# Patient Record
Sex: Male | Born: 2000 | Race: White | Hispanic: No | Marital: Single | Smoking: Never smoker
Health system: Southern US, Community
[De-identification: ages and names within clinical notes are randomized; demographics above are authoritative.]

## PROBLEM LIST (undated history)

## (undated) DIAGNOSIS — Q6601 Congenital talipes equinovarus, right foot: Secondary | ICD-10-CM

## (undated) DIAGNOSIS — Q6602 Congenital talipes equinovarus, left foot: Secondary | ICD-10-CM

## (undated) DIAGNOSIS — Q6689 Other  specified congenital deformities of feet: Secondary | ICD-10-CM

## (undated) HISTORY — PX: FOOT SURGERY: SHX648

---
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
## 2019-10-08 ENCOUNTER — Ambulatory Visit
Admission: EM | Admit: 2019-10-08 | Discharge: 2019-10-08 | Disposition: A | Discharge status: Home / Self Care | Payer: Medicaid Other | Attending: Emergency Medicine | Admitting: Emergency Medicine

## 2019-10-08 ENCOUNTER — Other Ambulatory Visit: Payer: Self-pay

## 2019-10-08 DIAGNOSIS — Z20822 Contact with and (suspected) exposure to covid-19: Secondary | ICD-10-CM | POA: Diagnosis not present

## 2019-10-08 NOTE — ED Provider Notes (Signed)
[facility]   [id number] 10/08/19 Arrival Time: 1244   CC: COVID exposure  SUBJECTIVE: History from: patient.  Bill Munoz is a 19 y.o. male who presents for COVID testing.  COVID exposure 3 days ago.  Denies recent travel.  Denies aggravating or alleviating symptoms.  Denies previous COVID infection.   Denies fever, chills, fatigue, nasal congestion, rhinorrhea, sore throat, cough, SOB, wheezing, chest pain, nausea, vomiting, changes in bowel or bladder habits.    ROS: As per HPI.  All other pertinent ROS negative.     Past Medical History:  Diagnosis Date  . Bilateral club feet    Past Surgical History:  Procedure Laterality Date  . FOOT SURGERY     bil club feet   No Known Allergies No current facility-administered medications on file prior to encounter.   Current Outpatient Medications on File Prior to Encounter  Medication Sig Dispense Refill  . cetirizine (ZYRTEC) 5 MG chewable tablet Chew 1 tablet (5 mg total) by mouth daily. 30 tablet 0  . predniSONE (DELTASONE) 10 MG tablet Take 4 tablets (40 mg total) by mouth daily. 12 tablet 0   Social History   Socioeconomic History  . Marital status: Single    Spouse name: Not on file  . Number of children: Not on file  . Years of education: Not on file  . Highest education level: Not on file  Occupational History  . Not on file  Tobacco Use  . Smoking status: Passive Smoke Exposure - Never Smoker  Substance and Sexual Activity  . Alcohol use: Not on file  . Drug use: Not on file  . Sexual activity: Not on file  Other Topics Concern  . Not on file  Social History Narrative  . Not on file   Social Determinants of Health   Financial Resource Strain:   . Difficulty of Paying Living Expenses: Not on file  Food Insecurity:   . Worried About Programme researcher, broadcasting/film/video in the Last Year: Not on file  . Ran Out of Food in the Last Year: Not on file  Transportation Needs:   . Lack of Transportation (Medical):  Not on file  . Lack of Transportation (Non-Medical): Not on file  Physical Activity:   . Days of Exercise per Week: Not on file  . Minutes of Exercise per Session: Not on file  Stress:   . Feeling of Stress : Not on file  Social Connections:   . Frequency of Communication with Friends and Family: Not on file  . Frequency of Social Gatherings with Friends and Family: Not on file  . Attends Religious Services: Not on file  . Active Member of Clubs or Organizations: Not on file  . Attends Banker Meetings: Not on file  . Marital Status: Not on file  Intimate Partner Violence:   . Fear of Current or Ex-Partner: Not on file  . Emotionally Abused: Not on file  . Physically Abused: Not on file  . Sexually Abused: Not on file   Family History  Problem Relation Age of Onset  . Healthy Mother   . Healthy Father     OBJECTIVE:  Vitals:   10/08/19 1327  BP: 110/61  Pulse: 83  Resp: 16  Temp: 97.9 F (36.6 C)  TempSrc: Oral  SpO2: 98%     General appearance: alert; well-appearing, nontoxic; speaking in full sentences and tolerating own secretions HEENT: NCAT; Ears: EACs clear, TMs pearly gray; Eyes: PERRL.  EOM  grossly intact.Nose: nares patent without rhinorrhea, Throat: oropharynx clear, tonsils non erythematous or enlarged, uvula midline  Neck: supple without LAD Lungs: unlabored respirations, symmetrical air entry; cough: absent; no respiratory distress; CTAB Heart: regular rate and rhythm.  Skin: warm and dry Psychological: alert and cooperative; normal mood and affect  ASSESSMENT & PLAN:  1. Suspected COVID-19 virus infection   2. Exposure to COVID-19 virus     No orders of the defined types were placed in this encounter.   COVID testing ordered.  It will take between 5-7 days for test results.  Someone will contact you regarding abnormal results.    In the meantime: You should remain isolated in your home for 10 days from symptom onset AND greater  than 72 hours after symptoms resolution (absence of fever without the use of fever-reducing medication and improvement in respiratory symptoms), whichever is longer OR 14 days from exposure Get plenty of rest and push fluids Use OTC zyrtec for nasal congestion, runny nose, and/or sore throat Use OTC flonase for nasal congestion and runny nose Use medications daily for symptom relief Use OTC medications like ibuprofen or tylenol as needed fever or pain Follow up with PCP regarding results Call or go to the ED if you have any new or worsening symptoms such as fever, cough, shortness of breath, chest tightness, chest pain, turning blue, changes in mental status, etc...   Reviewed expectations re: course of current medical issues. Questions answered. Outlined signs and symptoms indicating need for more acute intervention. Patient verbalized understanding. After Visit Summary given.         Lestine Box, PA-C 10/08/19 1342

## 2019-10-08 NOTE — Discharge Instructions (Signed)

## 2019-10-08 NOTE — ED Triage Notes (Signed)
Pt presents to UC for covid test. Pt states he rode in the car w/ a friend who was covid positive 3 days ago. Denies symptoms.

## 2019-10-09 LAB — NOVEL CORONAVIRUS, NAA: SARS-CoV-2, NAA: NOT DETECTED

## 2020-05-25 ENCOUNTER — Other Ambulatory Visit: Payer: Self-pay

## 2020-05-25 ENCOUNTER — Ambulatory Visit
Admission: EM | Admit: 2020-05-25 | Discharge: 2020-05-25 | Disposition: A | Discharge status: Home / Self Care | Payer: Medicaid Other | Attending: Emergency Medicine | Admitting: Emergency Medicine

## 2020-05-25 DIAGNOSIS — R6889 Other general symptoms and signs: Secondary | ICD-10-CM | POA: Diagnosis not present

## 2020-05-26 LAB — NOVEL CORONAVIRUS, NAA: SARS-CoV-2, NAA: NOT DETECTED

## 2020-05-30 ENCOUNTER — Ambulatory Visit
Admission: EM | Admit: 2020-05-30 | Discharge: 2020-05-30 | Disposition: A | Discharge status: Home / Self Care | Payer: Medicaid Other | Attending: Emergency Medicine | Admitting: Emergency Medicine

## 2020-05-30 ENCOUNTER — Encounter: Payer: Self-pay | Admitting: Emergency Medicine

## 2020-05-30 ENCOUNTER — Other Ambulatory Visit: Payer: Self-pay

## 2020-05-30 DIAGNOSIS — Z20822 Contact with and (suspected) exposure to covid-19: Secondary | ICD-10-CM | POA: Diagnosis not present

## 2020-05-30 NOTE — ED Triage Notes (Signed)
covid test exposure

## 2020-06-03 LAB — NOVEL CORONAVIRUS, NAA: SARS-CoV-2, NAA: DETECTED — AB

## 2020-06-12 ENCOUNTER — Ambulatory Visit
Admission: EM | Admit: 2020-06-12 | Discharge: 2020-06-12 | Discharge status: Absent without leave | Payer: Medicaid Other

## 2021-11-10 ENCOUNTER — Other Ambulatory Visit: Payer: Self-pay

## 2021-11-10 ENCOUNTER — Encounter: Payer: Self-pay | Admitting: Emergency Medicine

## 2021-11-10 ENCOUNTER — Ambulatory Visit
Admission: EM | Admit: 2021-11-10 | Discharge: 2021-11-10 | Disposition: A | Discharge status: Home / Self Care | Payer: BC Managed Care – PPO | Attending: Emergency Medicine | Admitting: Emergency Medicine

## 2021-11-10 DIAGNOSIS — B349 Viral infection, unspecified: Secondary | ICD-10-CM | POA: Diagnosis not present

## 2021-11-10 NOTE — ED Provider Notes (Signed)
Roderic Palau    CSN: [id number] Arrival date & time: 11/10/21  1429      History   Chief Complaint Chief Complaint  Patient presents with   Headache   Nasal Congestion    HPI YAAKOV SEPPALA is a 21 y.o. male.  Patient presents with 3-day history of sinus congestion, postnasal drip, runny nose, mild cough.  No fever, rash, shortness of breath, vomiting, diarrhea, or other symptoms.  Treatment at home with OTC cough syrup.  No pertinent medical history.   The history is provided by the patient.   Past Medical History:  Diagnosis Date   Bilateral club feet     There are no problems to display for this patient.   Past Surgical History:  Procedure Laterality Date   FOOT SURGERY     bil club feet       Home Medications    Prior to Admission medications   Medication Sig Start Date End Date Taking? Authorizing Provider  cetirizine (ZYRTEC) 5 MG chewable tablet Chew 1 tablet (5 mg total) by mouth daily. 02/26/16   Harvel Quale, MD  predniSONE (DELTASONE) 10 MG tablet Take 4 tablets (40 mg total) by mouth daily. 02/27/16   Harvel Quale, MD    Family History Family History  Problem Relation Age of Onset   Healthy Mother    Healthy Father     Social History Social History   Tobacco Use   Smoking status: Never    Passive exposure: Yes   Smokeless tobacco: Never  Vaping Use   Vaping Use: Every day  Substance Use Topics   Alcohol use: Never   Drug use: Never     Allergies   Patient has no known allergies.   Review of Systems Review of Systems  Constitutional:  Negative for chills and fever.  HENT:  Positive for congestion, postnasal drip and rhinorrhea. Negative for ear pain and sore throat.   Respiratory:  Positive for cough. Negative for shortness of breath.   Cardiovascular:  Negative for chest pain and palpitations.  Gastrointestinal:  Negative for diarrhea and vomiting.  Skin:  Negative for color change and rash.  All other  systems reviewed and are negative.   Physical Exam Triage Vital Signs ED Triage Vitals  Enc Vitals Group     BP      Pulse      Resp      Temp      Temp src      SpO2      Weight      Height      Head Circumference      Peak Flow      Pain Score      Pain Loc      Pain Edu?      Excl. in [place]?    No data found.  Updated Vital Signs BP (!) 146/90    Pulse (!) 113    Temp 98.2 F (36.8 C)    Resp 18    SpO2 96%   Visual Acuity Right Eye Distance:   Left Eye Distance:   Bilateral Distance:    Right Eye Near:   Left Eye Near:    Bilateral Near:     Physical Exam Vitals and nursing note reviewed.  Constitutional:      General: He is not in acute distress.    Appearance: Normal appearance. He is well-developed. He is not ill-appearing.  HENT:  Right Ear: Tympanic membrane normal.     Left Ear: Tympanic membrane normal.     Nose: Nose normal.     Mouth/Throat:     Mouth: Mucous membranes are moist.     Pharynx: Oropharynx is clear.  Cardiovascular:     Rate and Rhythm: Normal rate and regular rhythm.     Heart sounds: Normal heart sounds.  Pulmonary:     Effort: Pulmonary effort is normal. No respiratory distress.     Breath sounds: Normal breath sounds.  Musculoskeletal:     Cervical back: Neck supple.  Skin:    General: Skin is warm and dry.  Neurological:     Mental Status: He is alert.  Psychiatric:        Mood and Affect: Mood normal.        Behavior: Behavior normal.     UC Treatments / Results  Labs (all labs ordered are listed, but only abnormal results are displayed) Labs Reviewed  COVID-19, FLU A+B AND RSV    EKG   Radiology No results found.  Procedures Procedures (including critical care time)  Medications Ordered in UC Medications - No data to display  Initial Impression / Assessment and Plan / UC Course  I have reviewed the triage vital signs and the nursing notes.  Pertinent labs & imaging results that were available  during my care of the patient were reviewed by me and considered in my medical decision making (see chart for details).    Viral illness.  COVID, Flu, RSV pending.  Instructed patient to self quarantine per CDC guidelines.  Discussed symptomatic treatment including Tylenol or ibuprofen, rest, hydration.  Instructed patient to follow up with PCP if symptoms are not improving.  Patient agrees to plan of care.   Final Clinical Impressions(s) / UC Diagnoses   Final diagnoses:  Viral illness     Discharge Instructions      Your COVID, RSV, and Flu tests are pending.  You should self quarantine until the test results are back.    Take Tylenol or ibuprofen as needed for fever or discomfort.  Rest and keep yourself hydrated.    Follow-up with your primary care provider if your symptoms are not improving.         ED Prescriptions   None    PDMP not reviewed this encounter.   Sharion Balloon, NP 11/10/21 1511

## 2021-11-10 NOTE — ED Triage Notes (Signed)
Pt here with headache and pressure, congestion and drainage x 3 days.

## 2021-11-10 NOTE — Discharge Instructions (Addendum)
Your COVID, RSV, and Flu tests are pending.  You should self quarantine until the test results are back.    Take Tylenol or ibuprofen as needed for fever or discomfort.  Rest and keep yourself hydrated.    Follow-up with your primary care provider if your symptoms are not improving.     

## 2021-11-12 LAB — COVID-19, FLU A+B AND RSV
Influenza A, NAA: NOT DETECTED
Influenza B, NAA: NOT DETECTED
RSV, NAA: NOT DETECTED
SARS-CoV-2, NAA: NOT DETECTED

## 2021-11-16 ENCOUNTER — Ambulatory Visit (INDEPENDENT_AMBULATORY_CARE_PROVIDER_SITE_OTHER): Payer: BC Managed Care – PPO

## 2021-11-16 ENCOUNTER — Ambulatory Visit
Admission: RE | Admit: 2021-11-16 | Discharge: 2021-11-16 | Disposition: A | Discharge status: Home / Self Care | Payer: BC Managed Care – PPO | Source: Ambulatory Visit | Attending: Internal Medicine | Admitting: Internal Medicine

## 2021-11-16 ENCOUNTER — Other Ambulatory Visit: Payer: Self-pay

## 2021-11-16 VITALS — BP 131/84 | HR 122 | Temp 101.0°F | Resp 16

## 2021-11-16 DIAGNOSIS — R059 Cough, unspecified: Secondary | ICD-10-CM | POA: Diagnosis not present

## 2021-11-16 DIAGNOSIS — J209 Acute bronchitis, unspecified: Secondary | ICD-10-CM | POA: Diagnosis not present

## 2021-11-16 DIAGNOSIS — J039 Acute tonsillitis, unspecified: Secondary | ICD-10-CM | POA: Diagnosis not present

## 2021-11-16 LAB — POCT RAPID STREP A (OFFICE): Rapid Strep A Screen: NEGATIVE

## 2021-11-16 LAB — POCT MONO SCREEN (KUC): Mono, POC: NEGATIVE

## 2021-11-16 MED ORDER — ALBUTEROL SULFATE (2.5 MG/3ML) 0.083% IN NEBU
2.5000 mg | INHALATION_SOLUTION | Freq: Once | RESPIRATORY_TRACT | Status: AC
  Administered 2021-11-16: 2.5 mg via RESPIRATORY_TRACT

## 2021-11-16 MED ORDER — ALBUTEROL SULFATE HFA 108 (90 BASE) MCG/ACT IN AERS: 2.0000 | INHALATION_SPRAY | RESPIRATORY_TRACT | 0 refills | Status: AC | PRN

## 2021-11-16 MED ORDER — AZITHROMYCIN 500 MG PO TABS: 500.0000 mg | ORAL_TABLET | Freq: Every day | ORAL | 0 refills | Status: AC

## 2021-11-16 NOTE — ED Triage Notes (Signed)
Pt here with cough, fever, congestion x 2 weeks.

## 2021-11-16 NOTE — Discharge Instructions (Addendum)
Your mono test, strep A test and chest xray are all negative.  Work on stop vaping You may alternate Ibuprofen 800 mg every 8 hours and in between do Tylenol up to 1000 mg for the fever.

## 2021-11-16 NOTE — ED Provider Notes (Signed)
Bill Munoz    CSN: [id number] Arrival date & time: 11/16/21  1447      History   Chief Complaint Chief Complaint  Patient presents with   Cough   Fever   Nasal Congestion    HPI Bill Munoz is a 21 y.o. male who presents with URI x 1 week, but started having a fever and worse cough 3 days ago. Had negative Covid, Flu and RSV last week. He vapes and has been wheezing.     Past Medical History:  Diagnosis Date   Bilateral club feet     There are no problems to display for this patient.   Past Surgical History:  Procedure Laterality Date   FOOT SURGERY     bil club feet       Home Medications    Prior to Admission medications   Medication Sig Start Date End Date Taking? Authorizing Provider  albuterol (VENTOLIN HFA) 108 (90 Base) MCG/ACT inhaler Inhale 2 puffs into the lungs every 4 (four) hours as needed for wheezing or shortness of breath. For 5 days, then prn 11/16/21  Yes Rodriguez-Southworth, Sunday Spillers, PA-C  azithromycin (ZITHROMAX) 500 MG tablet Take 1 tablet (500 mg total) by mouth daily. 11/16/21  Yes Rodriguez-Southworth, Sunday Spillers, PA-C  cetirizine (ZYRTEC) 5 MG chewable tablet Chew 1 tablet (5 mg total) by mouth daily. 02/26/16   Harvel Quale, MD  predniSONE (DELTASONE) 10 MG tablet Take 4 tablets (40 mg total) by mouth daily. 02/27/16   Harvel Quale, MD    Family History Family History  Problem Relation Age of Onset   Healthy Mother    Healthy Father     Social History Social History   Tobacco Use   Smoking status: Never    Passive exposure: Yes   Smokeless tobacco: Never  Vaping Use   Vaping Use: Every day  Substance Use Topics   Alcohol use: Never   Drug use: Never     Allergies   Patient has no known allergies.   Review of Systems Review of Systems  Constitutional:  Positive for chills and fever. Negative for activity change and appetite change.  HENT:  Positive for congestion, rhinorrhea and sore throat.  Negative for postnasal drip and trouble swallowing.   Eyes:  Negative for discharge.  Respiratory:  Positive for cough and wheezing.   Musculoskeletal:  Positive for myalgias. Negative for gait problem.  Skin:  Negative for rash.  Neurological:  Negative for headaches.    Physical Exam Triage Vital Signs ED Triage Vitals  Enc Vitals Group     BP 11/16/21 1507 131/84     Pulse Rate 11/16/21 1507 (!) 122     Resp 11/16/21 1507 16     Temp 11/16/21 1507 (!) 101 F (38.3 C)     Temp Source 11/16/21 1507 Oral     SpO2 11/16/21 1507 95 %     Weight --      Height --      Head Circumference --      Peak Flow --      Pain Score 11/16/21 1453 0     Pain Loc --      Pain Edu? --      Excl. in [place]? --    No data found.  Updated Vital Signs BP 131/84 (BP Location: Left Arm)    Pulse (!) 122    Temp (!) 101 F (38.3 C) (Oral)    Resp 16  SpO2 95%   Visual Acuity Right Eye Distance:   Left Eye Distance:   Bilateral Distance:    Right Eye Near:   Left Eye Near:    Bilateral Near:     Physical Exam Vitals and nursing note reviewed.  Constitutional:      General: He is not in acute distress.    Appearance: He is not toxic-appearing.  HENT:     Right Ear: Tympanic membrane, ear canal and external ear normal.     Left Ear: Tympanic membrane, ear canal and external ear normal.     Nose: Nose normal.     Mouth/Throat:     Mouth: Mucous membranes are moist.     Pharynx: Oropharyngeal exudate and posterior oropharyngeal erythema present.  Eyes:     General: No scleral icterus.    Conjunctiva/sclera: Conjunctivae normal.  Pulmonary:     Effort: Pulmonary effort is normal.     Breath sounds: Wheezing present.  Musculoskeletal:        General: Normal range of motion.     Cervical back: Neck supple.  Lymphadenopathy:     Cervical: Cervical adenopathy present.  Skin:    General: Skin is warm and dry.     Findings: No rash.  Neurological:     Mental Status: He is alert and  oriented to person, place, and time.     Gait: Gait normal.  Psychiatric:        Mood and Affect: Mood normal.        Behavior: Behavior normal.        Thought Content: Thought content normal.        Judgment: Judgment normal.     UC Treatments / Results  Labs (all labs ordered are listed, but only abnormal results are displayed) Labs Reviewed  POCT RAPID STREP A (OFFICE) - Normal  CULTURE, GROUP A STREP [facility])  POCT MONO SCREEN ([facility])  Strep A and Mono negative  EKG   Radiology DG Chest 2 View  Result Date: 11/16/2021 CLINICAL DATA:  Cough. EXAM: CHEST - 2 VIEW COMPARISON:  October 06, 2015. FINDINGS: The heart size and mediastinal contours are within normal limits. Both lungs are clear. The visualized skeletal structures are unremarkable. IMPRESSION: No active cardiopulmonary disease. Electronically Signed   By: Marijo Conception M.D.   On: 11/16/2021 15:27    Procedures Procedures (including critical care time)  Medications Ordered in UC Medications  albuterol (PROVENTIL) (2.5 MG/3ML) 0.083% nebulizer solution 2.5 mg (2.5 mg Nebulization Given 11/16/21 1540)    Initial Impression / Assessment and Plan / UC Course  I have reviewed the triage vital signs and the nursing notes. Pertinent labs & imaging results that were available during my care of the patient were reviewed by me and considered in my medical decision making (see chart for details). He was given albuterol neb treatment and the wheezing resolved.  Has exudative tonsillitis and bronchitis I sent out the throat culture Started him on Azithromycin and Proventil as noted. See instructions.    Final Clinical Impressions(s) / UC Diagnoses   Final diagnoses:  Exudative tonsillitis  Acute bronchitis, unspecified organism     Discharge Instructions      Your mono test, strep A test and chest xray are all negative.  Work on stop vaping You may alternate Ibuprofen 800 mg every 8 hours and in between do Tylenol  up to 1000 mg for the fever.      ED Prescriptions  Medication Sig Dispense Auth. Provider   albuterol (VENTOLIN HFA) 108 (90 Base) MCG/ACT inhaler Inhale 2 puffs into the lungs every 4 (four) hours as needed for wheezing or shortness of breath. For 5 days, then prn 18 g Rodriguez-Southworth, Dong Nimmons, PA-C   azithromycin (ZITHROMAX) 500 MG tablet Take 1 tablet (500 mg total) by mouth daily. 5 tablet Rodriguez-Southworth, Sunday Spillers, PA-C      PDMP not reviewed this encounter.   Shelby Mattocks, [place] 11/16/21 1630

## 2021-11-19 LAB — CULTURE, GROUP A STREP (THRC)

## 2023-07-22 ENCOUNTER — Emergency Department: Payer: BC Managed Care – PPO

## 2023-07-22 ENCOUNTER — Other Ambulatory Visit: Payer: Self-pay

## 2023-07-22 DIAGNOSIS — S0101XA Laceration without foreign body of scalp, initial encounter: Secondary | ICD-10-CM | POA: Diagnosis not present

## 2023-07-22 DIAGNOSIS — S0990XA Unspecified injury of head, initial encounter: Secondary | ICD-10-CM | POA: Diagnosis present

## 2023-07-22 DIAGNOSIS — Z23 Encounter for immunization: Secondary | ICD-10-CM | POA: Insufficient documentation

## 2023-07-22 DIAGNOSIS — W01190A Fall on same level from slipping, tripping and stumbling with subsequent striking against furniture, initial encounter: Secondary | ICD-10-CM | POA: Insufficient documentation

## 2023-07-22 NOTE — ED Triage Notes (Signed)
Pt presents to ER from home with laceration to back of head. Pt reports he was drinking alcohol and slipped and fell backwards, hitting the back of his head on the corner of the coffee table. Pt has small laceration to back of head no bleeding at present. Pt denies losing consciousness. Pt talks in complete sentences no distress noted

## 2023-07-23 ENCOUNTER — Emergency Department
Admission: EM | Admit: 2023-07-23 | Discharge: 2023-07-23 | Disposition: A | Discharge status: Home / Self Care | Payer: BC Managed Care – PPO | Attending: Emergency Medicine | Admitting: Emergency Medicine

## 2023-07-23 DIAGNOSIS — W19XXXA Unspecified fall, initial encounter: Secondary | ICD-10-CM

## 2023-07-23 DIAGNOSIS — S0990XA Unspecified injury of head, initial encounter: Secondary | ICD-10-CM

## 2023-07-23 DIAGNOSIS — S0101XA Laceration without foreign body of scalp, initial encounter: Secondary | ICD-10-CM

## 2023-07-23 MED ORDER — TETANUS-DIPHTH-ACELL PERTUSSIS 5-2.5-18.5 LF-MCG/0.5 IM SUSY
0.5000 mL | PREFILLED_SYRINGE | Freq: Once | INTRAMUSCULAR | Status: AC
  Administered 2023-07-23: 0.5 mL via INTRAMUSCULAR
  Filled 2023-07-23: qty 0.5

## 2023-07-23 NOTE — Discharge Instructions (Signed)
Your tetanus has been updated and will be good for 10 years.  Return to the ER for worsening symptoms, persistent vomiting, lethargy or other concerns.

## 2023-07-23 NOTE — ED Provider Notes (Signed)
[facility] Provider Note    Event Date/Time   First MD Initiated Contact with Patient 07/23/23 0222     (approximate)   History   Fall   HPI  Bill Munoz is a 22 y.o. male who presents to the ED from home with a chief complaint of head injury.  Patient states he was drinking alcohol, slipped and fell backwards striking the corner of his wooden coffee table with the back of his head.  Denies LOC.  Tetanus is not up-to-date.  Denies vision changes, headache, neck pain, chest pain, shortness of breath, nausea, vomiting or dizziness.  Denies anticoagulant use.     Past Medical History   Past Medical History:  Diagnosis Date   Bilateral club feet      Active Problem List  There are no problems to display for this patient.    Past Surgical History   Past Surgical History:  Procedure Laterality Date   FOOT SURGERY     bil club feet     Home Medications   Prior to Admission medications   Medication Sig Start Date End Date Taking? Authorizing Provider  albuterol (VENTOLIN HFA) 108 (90 Base) MCG/ACT inhaler Inhale 2 puffs into the lungs every 4 (four) hours as needed for wheezing or shortness of breath. For 5 days, then prn 11/16/21   Rodriguez-Southworth, Nettie Elm, PA-C  azithromycin (ZITHROMAX) 500 MG tablet Take 1 tablet (500 mg total) by mouth daily. 11/16/21   Rodriguez-Southworth, Nettie Elm, PA-C  cetirizine (ZYRTEC) 5 MG chewable tablet Chew 1 tablet (5 mg total) by mouth daily. 02/26/16   Leta Baptist, MD  predniSONE (DELTASONE) 10 MG tablet Take 4 tablets (40 mg total) by mouth daily. 02/27/16   Leta Baptist, MD     Allergies  Patient has no known allergies.   Family History   Family History  Problem Relation Age of Onset   Healthy Mother    Healthy Father      Physical Exam  Triage Vital Signs: ED Triage Vitals  Encounter Vitals Group     BP 07/22/23 2243 (!) 145/86     Systolic BP Percentile --      Diastolic BP  Percentile --      Pulse Rate 07/22/23 2243 91     Resp 07/22/23 2243 16     Temp 07/22/23 2243 98.4 F (36.9 C)     Temp Source 07/22/23 2243 Oral     SpO2 07/22/23 2243 95 %     Weight 07/22/23 2245 200 lb (90.7 kg)     Height 07/22/23 2245 5\' 3"  (1.6 m)     Head Circumference --      Peak Flow --      Pain Score 07/22/23 2245 5     Pain Loc --      Pain Education --      Exclude from Growth Chart --     Updated Vital Signs: BP (!) 145/86   Pulse 91   Temp 98.4 F (36.9 C) (Oral)   Resp 16   Ht 5\' 3"  (1.6 m)   Wt 90.7 kg   SpO2 95%   BMI 35.43 kg/m    General: Awake, no distress.  CV:  Good peripheral perfusion.  Resp:  Normal effort.  Abd:  No distention.  Other:  Small puncture wound to occipital scalp without active bleeding.  PERRL.  EOMI.  Nose is atraumatic.  No dental malocclusion.  No midline cervical spine  tenderness to palpation, step-offs or deformities noted.  Alert and oriented x 3.  CN II-XII grossly intact.  5/5 motor strength and sensation all extremities.  M AE x 4.   ED Results / Procedures / Treatments  Labs (all labs ordered are listed, but only abnormal results are displayed) Labs Reviewed - No data to display   EKG  None   RADIOLOGY I have independently visualized and interpreted patient's imaging studies as well as noted the radiology interpretation:  CT head: No ICH  CT cervical spine: No acute traumatic injury  Official radiology report(s): CT Head Wo Contrast  Result Date: 07/23/2023 CLINICAL DATA:  Intoxication, fall, scalp laceration. Head trauma, moderate-severe; Neck trauma, intoxicated or obtunded (Age >= 16y) EXAM: CT HEAD WITHOUT CONTRAST CT CERVICAL SPINE WITHOUT CONTRAST TECHNIQUE: Multidetector CT imaging of the head and cervical spine was performed following the standard protocol without intravenous contrast. Multiplanar CT image reconstructions of the cervical spine were also generated. RADIATION DOSE REDUCTION: This  exam was performed according to the departmental dose-optimization program which includes automated exposure control, adjustment of the mA and/or kV according to patient size and/or use of iterative reconstruction technique. COMPARISON:  None Available. FINDINGS: CT HEAD FINDINGS Brain: Normal anatomic configuration. No abnormal intra or extra-axial mass lesion or fluid collection. No abnormal mass effect or midline shift. No evidence of acute intracranial hemorrhage or infarct. Ventricular size is normal. Cerebellum unremarkable. Vascular: Unremarkable Skull: Intact Sinuses/Orbits: Paranasal sinuses are clear. Orbits are unremarkable. Other: Mastoid air cells and middle ear cavities are clear. Small right parasagittal occipital scalp hematoma noted. CT CERVICAL SPINE FINDINGS Alignment: Normal. Skull base and vertebrae: No acute fracture. No primary bone lesion or focal pathologic process. Soft tissues and spinal canal: No prevertebral fluid or swelling. No visible canal hematoma. Disc levels: Intervertebral disc heights are preserved. Prevertebral soft tissues are not thickened on sagittal reformats . Spinal canal is widely patent. No significant neuroforaminal narrowing. Upper chest: Negative. Other: None IMPRESSION: 1. No acute intracranial abnormality. No calvarial fracture. Small right parasagittal occipital scalp hematoma. 2. No acute fracture or listhesis of the cervical spine. Electronically Signed   By: Helyn Numbers M.D.   On: 07/23/2023 00:09   CT Cervical Spine Wo Contrast  Result Date: 07/23/2023 CLINICAL DATA:  Intoxication, fall, scalp laceration. Head trauma, moderate-severe; Neck trauma, intoxicated or obtunded (Age >= 16y) EXAM: CT HEAD WITHOUT CONTRAST CT CERVICAL SPINE WITHOUT CONTRAST TECHNIQUE: Multidetector CT imaging of the head and cervical spine was performed following the standard protocol without intravenous contrast. Multiplanar CT image reconstructions of the cervical spine were  also generated. RADIATION DOSE REDUCTION: This exam was performed according to the departmental dose-optimization program which includes automated exposure control, adjustment of the mA and/or kV according to patient size and/or use of iterative reconstruction technique. COMPARISON:  None Available. FINDINGS: CT HEAD FINDINGS Brain: Normal anatomic configuration. No abnormal intra or extra-axial mass lesion or fluid collection. No abnormal mass effect or midline shift. No evidence of acute intracranial hemorrhage or infarct. Ventricular size is normal. Cerebellum unremarkable. Vascular: Unremarkable Skull: Intact Sinuses/Orbits: Paranasal sinuses are clear. Orbits are unremarkable. Other: Mastoid air cells and middle ear cavities are clear. Small right parasagittal occipital scalp hematoma noted. CT CERVICAL SPINE FINDINGS Alignment: Normal. Skull base and vertebrae: No acute fracture. No primary bone lesion or focal pathologic process. Soft tissues and spinal canal: No prevertebral fluid or swelling. No visible canal hematoma. Disc levels: Intervertebral disc heights are preserved. Prevertebral soft tissues are  not thickened on sagittal reformats . Spinal canal is widely patent. No significant neuroforaminal narrowing. Upper chest: Negative. Other: None IMPRESSION: 1. No acute intracranial abnormality. No calvarial fracture. Small right parasagittal occipital scalp hematoma. 2. No acute fracture or listhesis of the cervical spine. Electronically Signed   By: Helyn Numbers M.D.   On: 07/23/2023 00:09     PROCEDURES:  Critical Care performed: No  Procedures   MEDICATIONS ORDERED IN ED: Medications  Tdap (BOOSTRIX) injection 0.5 mL (has no administration in time range)     IMPRESSION / MDM / ASSESSMENT AND PLAN / ED COURSE  I reviewed the triage vital signs and the nursing notes.                             22 year old male presenting with minor head injury.  Offered staple repair for scalp  puncture wound; patient declines.  Will update tetanus.  Strict return precautions given.  Patient verbalizes understanding and agrees with plan of care.  Patient's presentation is most consistent with acute, uncomplicated illness.   FINAL CLINICAL IMPRESSION(S) / ED DIAGNOSES   Final diagnoses:  Fall, initial encounter  Laceration of scalp, initial encounter  Minor head injury, initial encounter     Rx / DC Orders   ED Discharge Orders     None        Note:  This document was prepared using Dragon voice recognition software and may include unintentional dictation errors.   Irean Hong, MD 07/23/23 [phone]

## 2023-09-19 IMAGING — DX DG CHEST 2V
2 series · 2 of 2 positions shown · non-contrast
Comparison: October 06, 2015.

CLINICAL DATA: Cough.

EXAM:
CHEST - 2 VIEW

[chest pa]
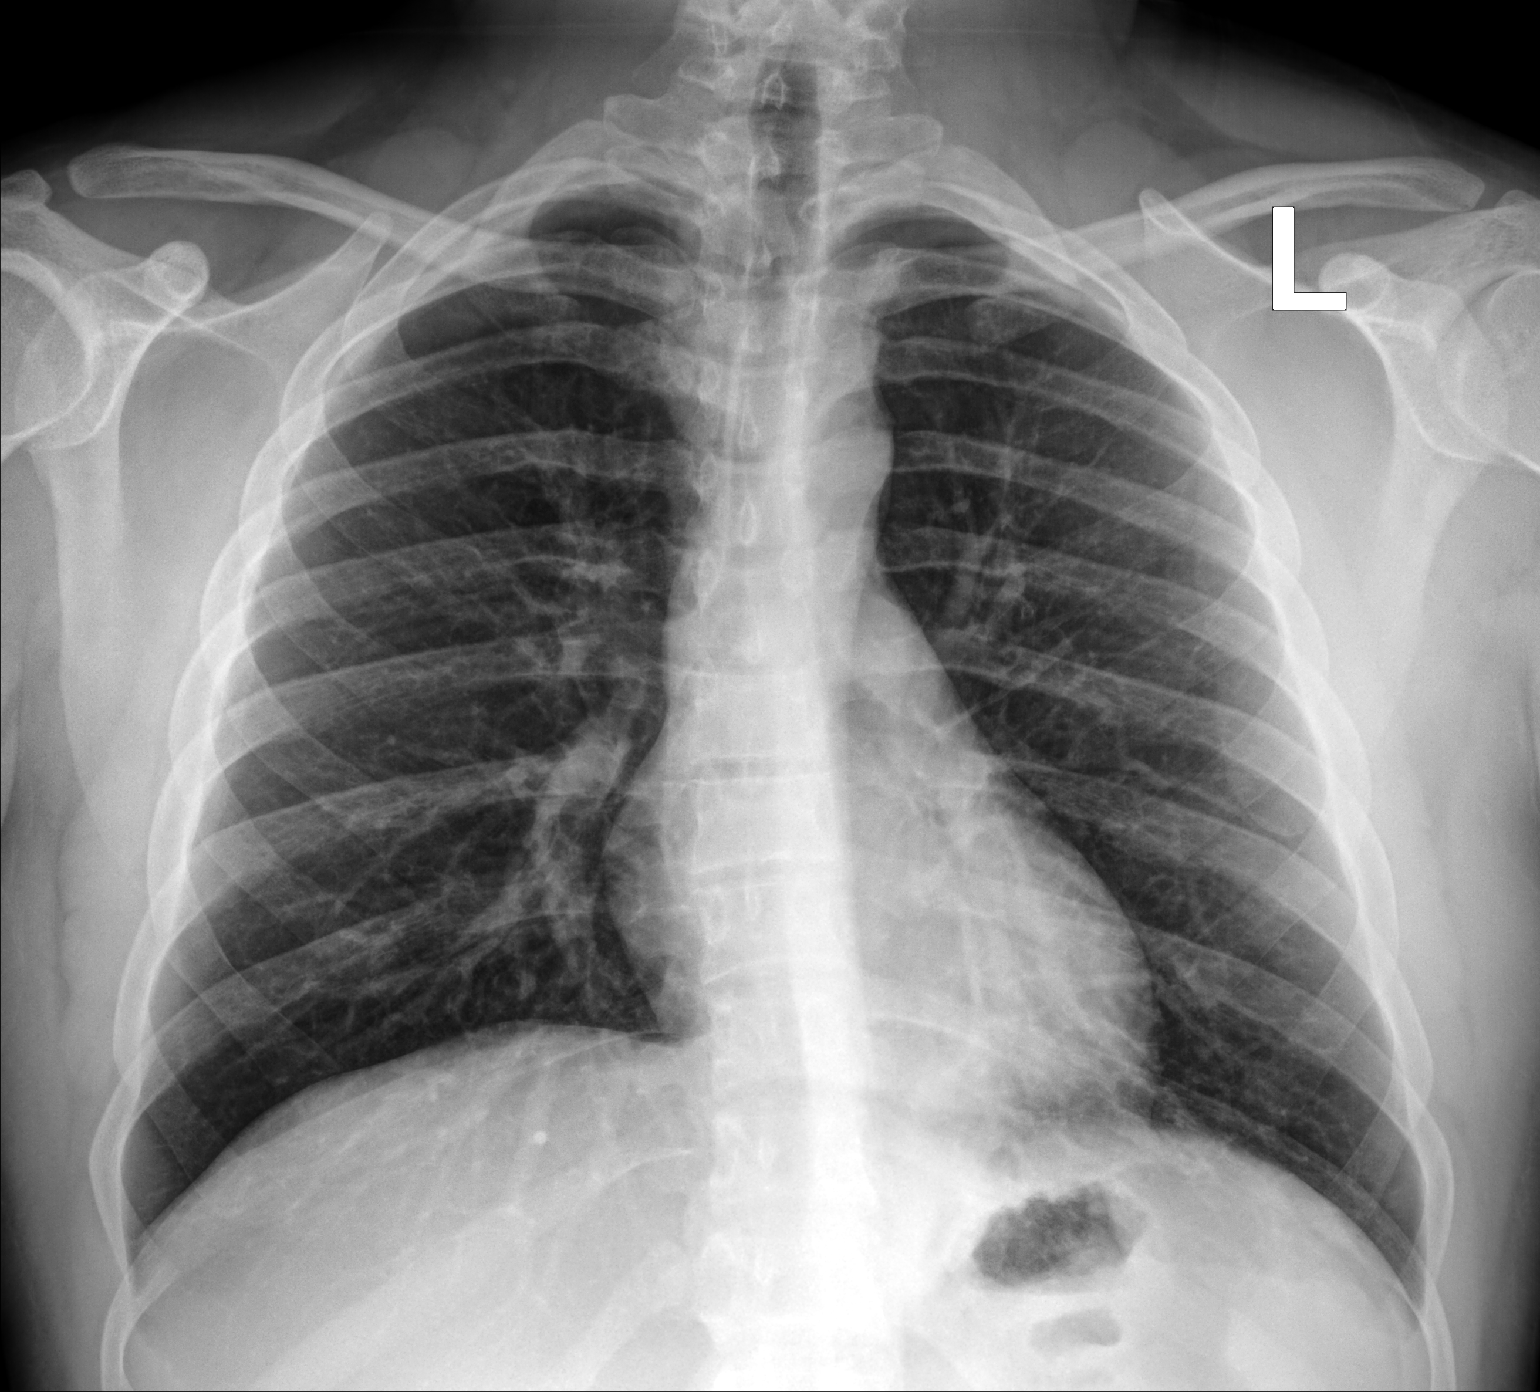

[chest lat]
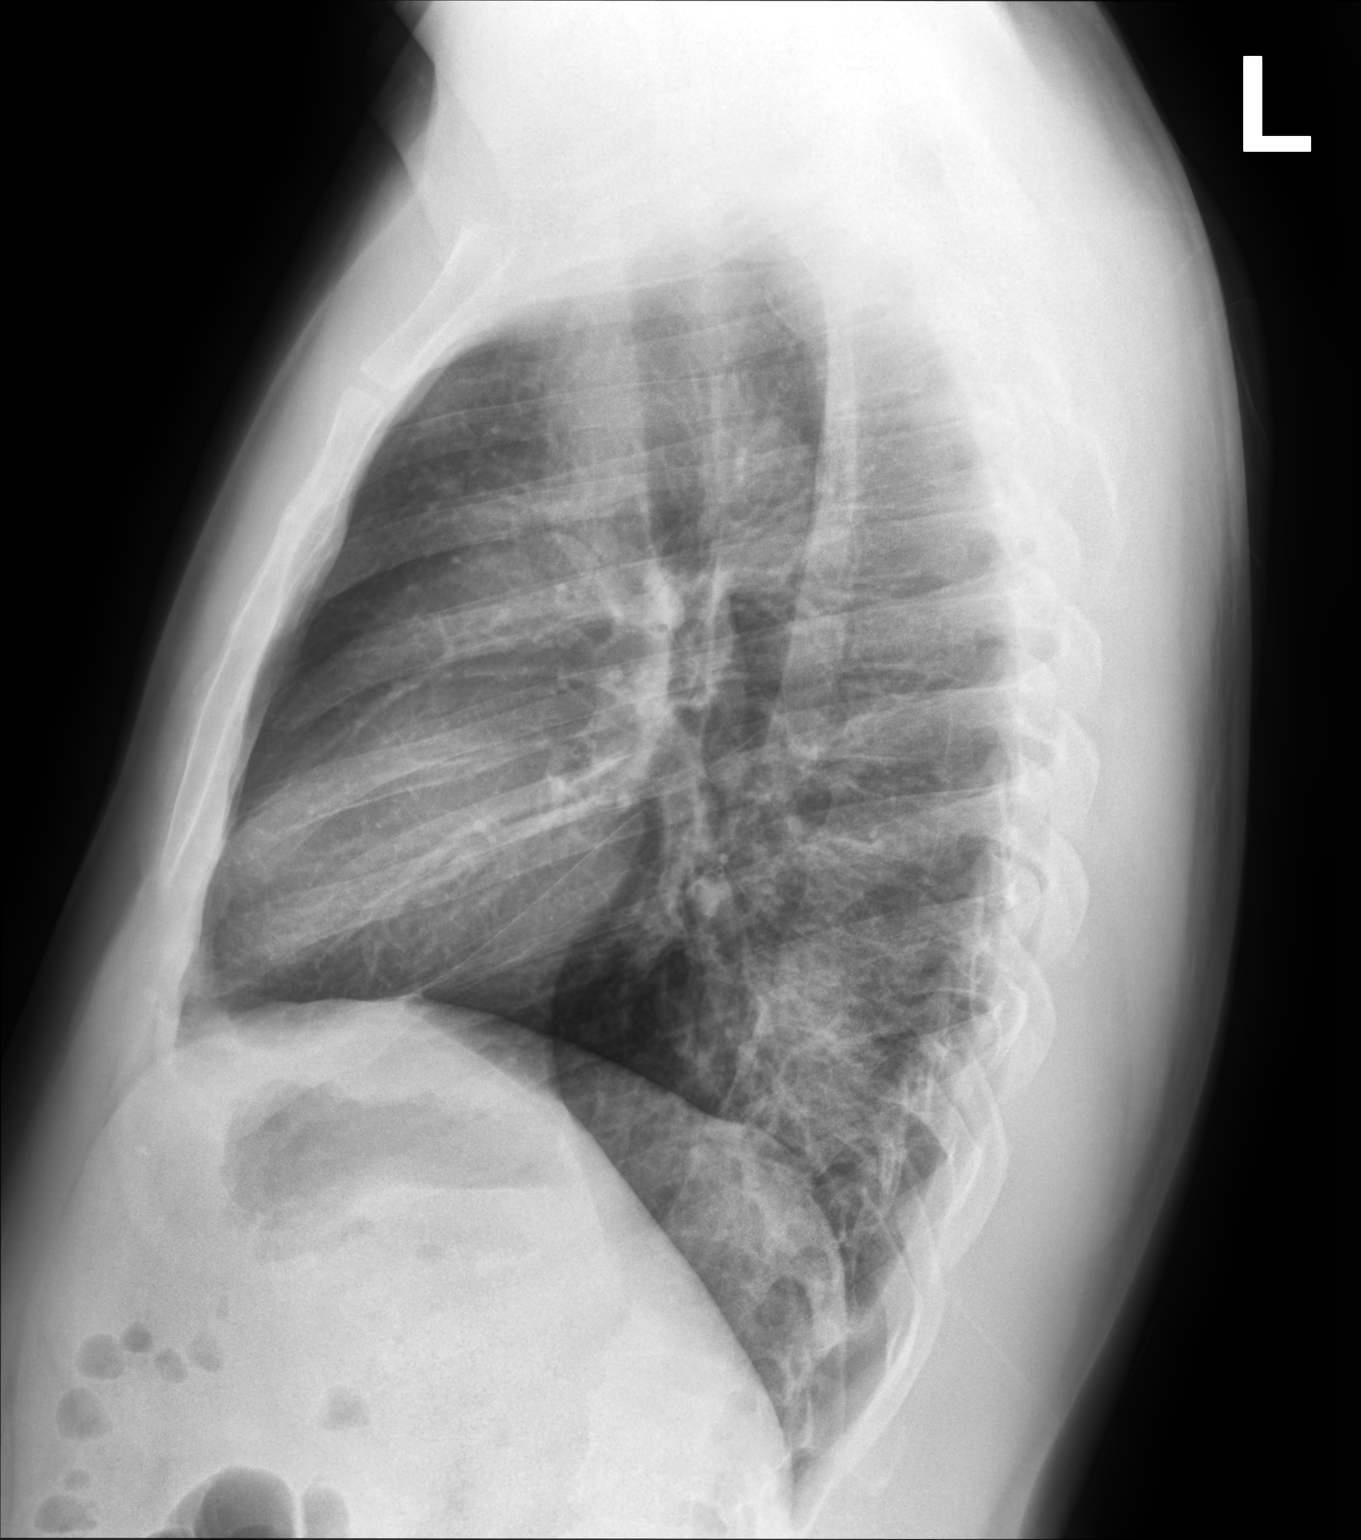

[2 of 2 positions shown; findings below may reference images not displayed]

FINDINGS: The heart size and mediastinal contours are within normal limits.
Both lungs are clear. The visualized skeletal structures are
unremarkable.
IMPRESSION: No active cardiopulmonary disease.

## 2024-04-22 ENCOUNTER — Encounter: Payer: Self-pay | Admitting: Emergency Medicine

## 2024-04-22 ENCOUNTER — Ambulatory Visit
Admission: EM | Admit: 2024-04-22 | Discharge: 2024-04-22 | Disposition: A | Discharge status: Home / Self Care | Attending: Emergency Medicine | Admitting: Emergency Medicine

## 2024-04-22 DIAGNOSIS — L237 Allergic contact dermatitis due to plants, except food: Secondary | ICD-10-CM | POA: Diagnosis not present

## 2024-04-22 MED ORDER — PREDNISONE 10 MG (21) PO TBPK: ORAL_TABLET | Freq: Every day | ORAL | 0 refills | Status: AC

## 2024-04-22 MED ORDER — DEXAMETHASONE SODIUM PHOSPHATE 10 MG/ML IJ SOLN: 10.0000 mg | Freq: Once | INTRAMUSCULAR | Status: DC

## 2024-04-22 NOTE — Discharge Instructions (Signed)
Today you are being treated for the poison ivy/oak rash  You have been given an injection of steroids today in the office today to help reduce the inflammatory process that occurs with this rash which will help minimize your itching as well as begin to clear  Starting tomorrow take prednisone every morning with food as directed, to continue the above process  You may continue use of topical calamine or Benadryl cream to help manage itching, you may also continue oral Benadryl  Please avoid long exposures to heat such as a hot steamy shower or being outside as this may cause further irritation to your rash  You may follow-up with his urgent care as needed if symptoms persist or worsen 

## 2024-04-22 NOTE — ED Triage Notes (Signed)
 Patient reports red itchy rash that started Friday. 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 Patient has used poison ivy  cream and benadryl  for symptoms.  Last dose of Benadryl  was last night took 50 mg. Denies pain.

## 2024-04-22 NOTE — ED Provider Notes (Signed)
 Bill Munoz    CSN: [id number] Arrival date & time: 04/22/24  1226      History   Chief Complaint Chief Complaint  Patient presents with   Rash    HPI Bill Munoz is a 23 y.o. male.   Patient presents for evaluation of a erythematous pruritic rash present to the bilateral upper extremities and the chest beginning 2 days ago.  Noticed some clear drainage to the left arm but denies purulent drainage.  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  Denies changes in diet, toiletries, medications or recent travel, known sick contact with similar symptoms.  Denies fever.  Has attempted use of poison ivy cream and Benadryl  without relief.  Past Medical History:  Diagnosis Date   Bilateral club feet     There are no active problems to display for this patient.   Past Surgical History:  Procedure Laterality Date   FOOT SURGERY     bil club feet       Home Medications    Prior to Admission medications   Medication Sig Start Date End Date Taking? Authorizing Provider  predniSONE  (STERAPRED UNI-PAK 21 TAB) 10 MG (21) TBPK tablet Take by mouth daily. Take 6 tabs by mouth daily  for 1 days, then 5 tabs for 1 days, then 4 tabs for 1 days, then 3 tabs for 1 days, 2 tabs for 1 days, then 1 tab by mouth daily for 1 days 04/22/24  Yes Amayrani Bennick R, NP  albuterol  (VENTOLIN  HFA) 108 (90 Base) MCG/ACT inhaler Inhale 2 puffs into the lungs every 4 (four) hours as needed for wheezing or shortness of breath. For 5 days, then prn 11/16/21   Rodriguez-Southworth, Sylvia, PA-C  azithromycin  (ZITHROMAX ) 500 MG tablet Take 1 tablet (500 mg total) by mouth daily. 11/16/21   Rodriguez-Southworth, Sylvia, PA-C  cetirizine  (ZYRTEC ) 5 MG chewable tablet Chew 1 tablet (5 mg total) by mouth daily. 02/26/16   Leontine Damien Boor, MD    Family History Family History  Problem Relation Age of Onset   Healthy Mother    Healthy Father     Social  History Social History   Tobacco Use   Smoking status: Never    Passive exposure: Yes   Smokeless tobacco: Never  Vaping Use   Vaping status: Every Day  Substance Use Topics   Alcohol use: Never   Drug use: Never     Allergies   Patient has no known allergies.   Review of Systems Review of Systems  Skin:  Positive for rash.     Physical Exam Triage Vital Signs ED Triage Vitals  Encounter Vitals Group     BP 04/22/24 1230 127/80     Girls Systolic BP Percentile --      Girls Diastolic BP Percentile --      Boys Systolic BP Percentile --      Boys Diastolic BP Percentile --      Pulse Rate 04/22/24 1230 73     Resp 04/22/24 1230 18     Temp 04/22/24 1230 98.3 F (36.8 C)     Temp Source 04/22/24 1230 Oral     SpO2 04/22/24 1230 99 %     Weight --      Height --      Head Circumference --      Peak Flow --      Pain Score 04/22/24 1234 0  Pain Loc --      Pain Education --      Exclude from Growth Chart --    No data found.  Updated Vital Signs BP 127/80 (BP Location: Right Arm)   Pulse 73   Temp 98.3 F (36.8 C) (Oral)   Resp 18   SpO2 99%   Visual Acuity Right Eye Distance:   Left Eye Distance:   Bilateral Distance:    Right Eye Near:   Left Eye Near:    Bilateral Near:     Physical Exam Constitutional:      Appearance: Normal appearance.  Eyes:     Extraocular Movements: Extraocular movements intact.  Pulmonary:     Effort: Pulmonary effort is normal.  Skin:    Comments: Erythematous blistering rash present to the bilateral upper extremities and across the center of the chest, clear serous drainage noted to the blister on the left arm  Neurological:     Mental Status: He is alert and oriented to person, place, and time.      UC Treatments / Results  Labs (all labs ordered are listed, but only abnormal results are displayed) Labs Reviewed - No data to display  EKG   Radiology No results found.  Procedures Procedures  (including critical care time)  Medications Ordered in UC Medications - No data to display  Initial Impression / Assessment and Plan / UC Course  I have reviewed the triage vital signs and the nursing notes.  Pertinent labs & imaging results that were available during my care of the patient were reviewed by me and considered in my medical decision making (see chart for details).  Poison ivy dermatitis  Presentation and symptomology consistent with above diagnosis, discussed this with patient, Decadron  IM given and prescribed prednisone  for home use advised against long exposure to heat and discussed over-the-counter supportive care for management of pruritus, advised to follow-up if symptoms persist or worsen, work note given Final Clinical Impressions(s) / UC Diagnoses   Final diagnoses:  Poison ivy dermatitis     Discharge Instructions      Today you are being treated for the poison ivy/oak rash  You have been given an injection of steroids today in the office today to help reduce the inflammatory process that occurs with this rash which will help minimize your itching as well as begin to clear  Starting tomorrow take prednisone  every morning with food as directed, to continue the above process  You may continue use of topical calamine or Benadryl  cream to help manage itching, you may also continue oral Benadryl   Please avoid long exposures to heat such as a hot steamy shower or being outside as this may cause further irritation to your rash  You may follow-up with his urgent care as needed if symptoms persist or worsen    ED Prescriptions     Medication Sig Dispense Auth. Provider   predniSONE  (STERAPRED UNI-PAK 21 TAB) 10 MG (21) TBPK tablet Take by mouth daily. Take 6 tabs by mouth daily  for 1 days, then 5 tabs for 1 days, then 4 tabs for 1 days, then 3 tabs for 1 days, 2 tabs for 1 days, then 1 tab by mouth daily for 1 days 21 tablet Milfred Krammes, Shelba SAUNDERS, NP      PDMP  not reviewed this encounter.   Teresa Shelba SAUNDERS, NP 04/22/24 1315
